# Patient Record
Sex: Female | Born: 1990 | Race: White | Hispanic: No | Marital: Single
Health system: Southern US, Community
[De-identification: ages and names within clinical notes are randomized; demographics above are authoritative.]

---

## 2013-06-23 ENCOUNTER — Ambulatory Visit
Admission: RE | Admit: 2013-06-23 | Discharge: 2013-06-23 | Disposition: A | Discharge status: Home / Self Care | Payer: No Typology Code available for payment source | Source: Ambulatory Visit | Attending: *Deleted | Admitting: *Deleted

## 2013-06-23 ENCOUNTER — Other Ambulatory Visit: Payer: Self-pay | Admitting: *Deleted

## 2013-06-23 DIAGNOSIS — R7611 Nonspecific reaction to tuberculin skin test without active tuberculosis: Secondary | ICD-10-CM

## 2015-02-12 IMAGING — CR DG CHEST 2V
2 series · 2 of 2 positions shown · non-contrast
Comparison: None.

CLINICAL DATA: Positive PPD, cough

EXAM:
CHEST  2 VIEW

[w chest pa]
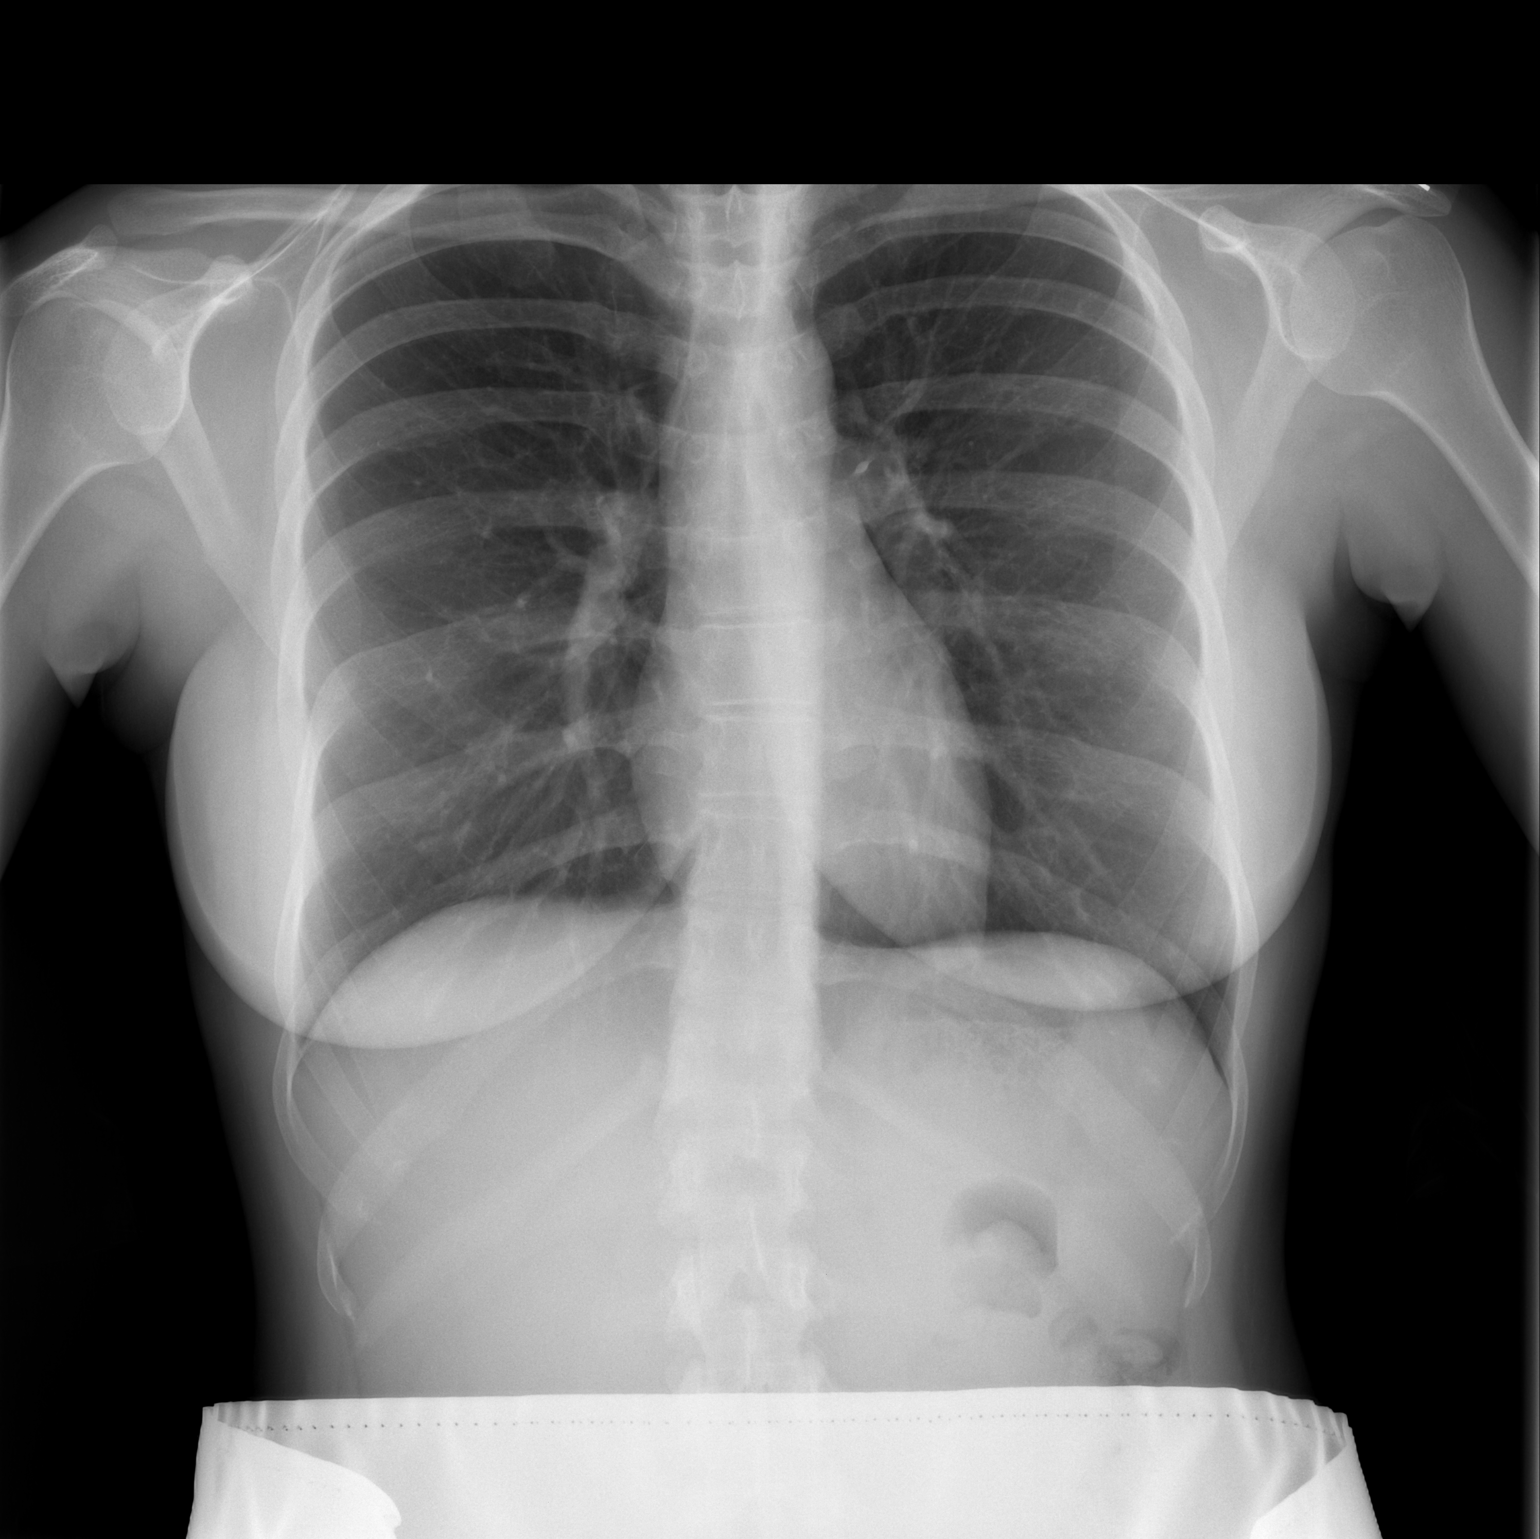

[w chest lat]
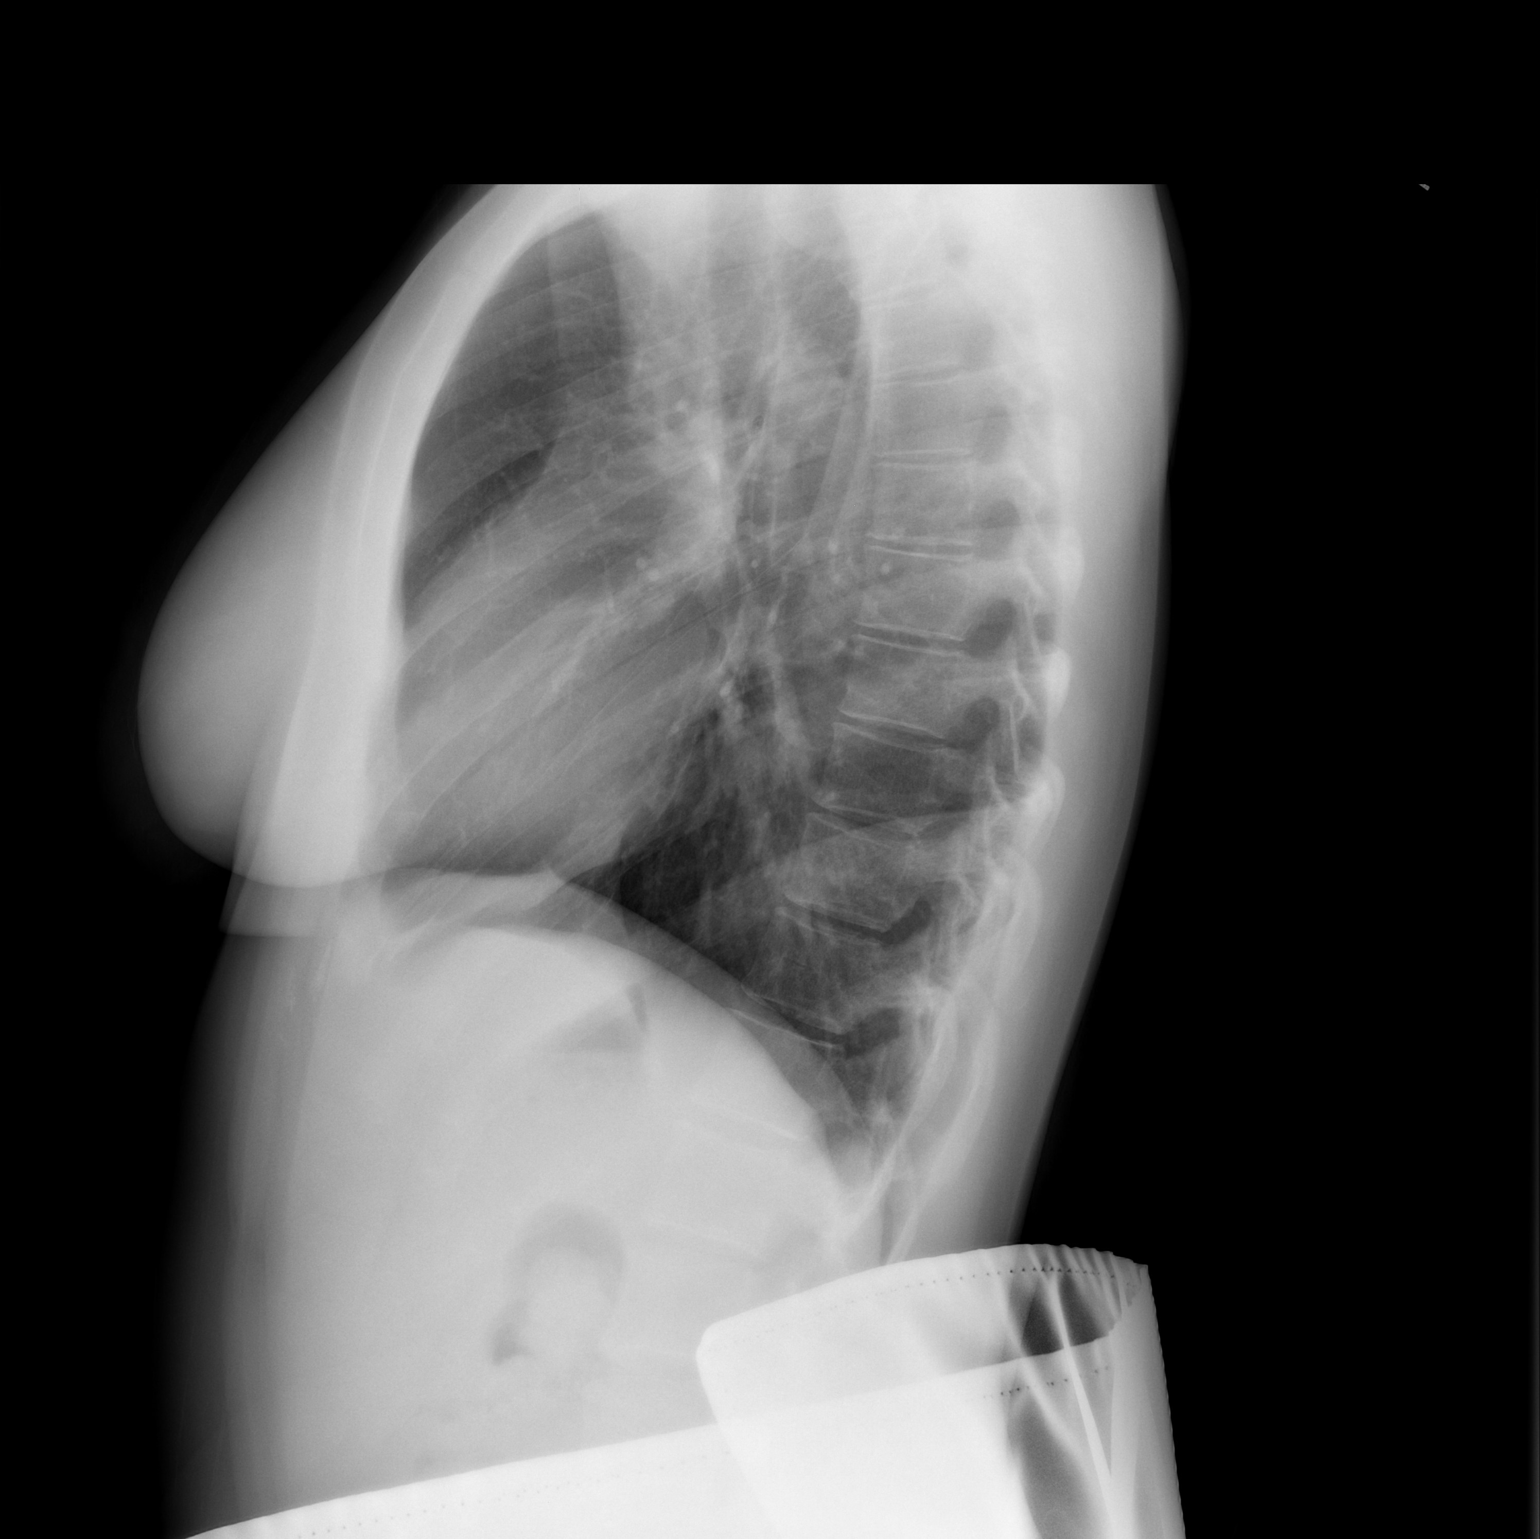

[2 of 2 positions shown; findings below may reference images not displayed]

FINDINGS: No active infiltrate or effusion is seen. Mediastinal and hilar
contours are within normal limits. The heart is normal in size. No
bony abnormality is seen.
IMPRESSION: No active cardiopulmonary disease.
